# Patient Record
Sex: Female | Born: 1995 | Race: Asian | Hispanic: No | Marital: Single
Health system: Southern US, Community
[De-identification: ages and names within clinical notes are randomized; demographics above are authoritative.]

## PROBLEM LIST (undated history)

## (undated) DIAGNOSIS — M303 Mucocutaneous lymph node syndrome [Kawasaki]: Secondary | ICD-10-CM

## (undated) HISTORY — DX: Mucocutaneous lymph node syndrome (kawasaki): M30.3

---
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
## 2015-06-08 IMAGING — CR DG ELBOW COMPLETE 3+V*R*
3 series · 3 of 3 positions shown · non-contrast
Comparison: None.

CLINICAL DATA: Pain

RIGHT ELBOW - COMPLETE 3+ VIEW

[view not recorded (1 of 3)]
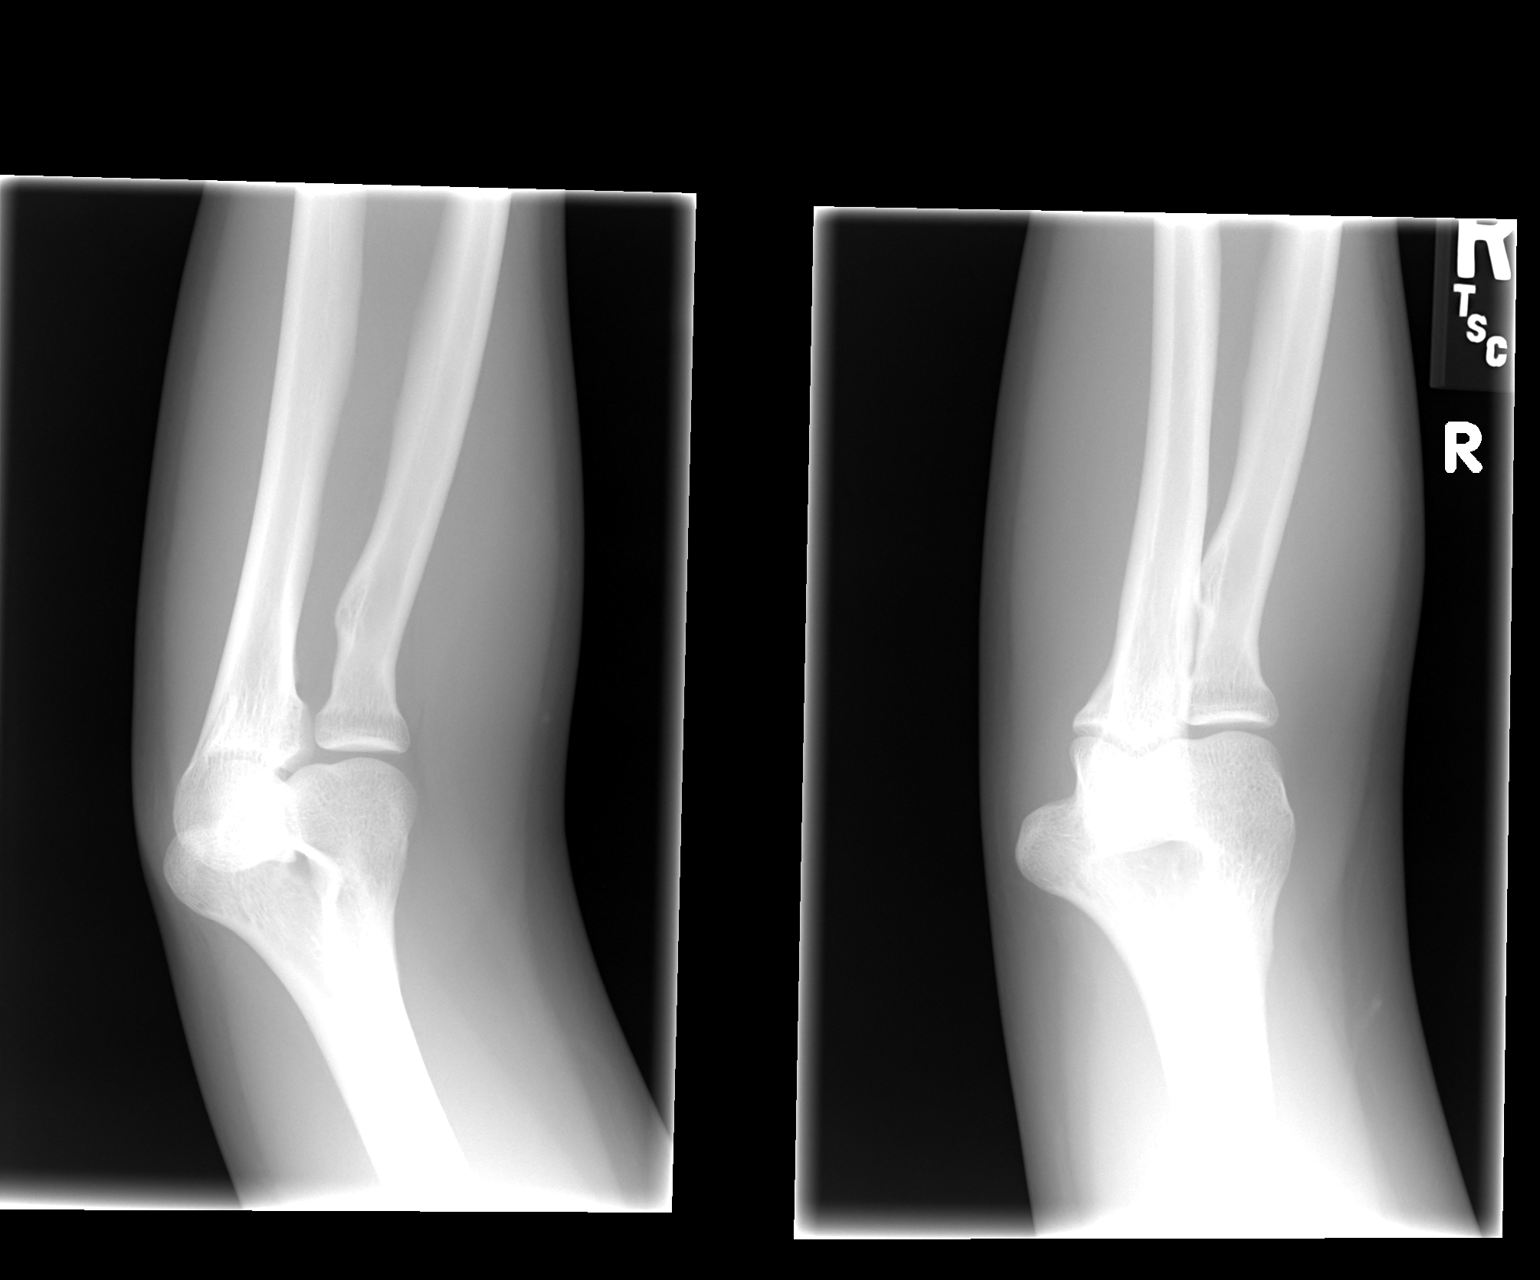

[view not recorded (2 of 3)]
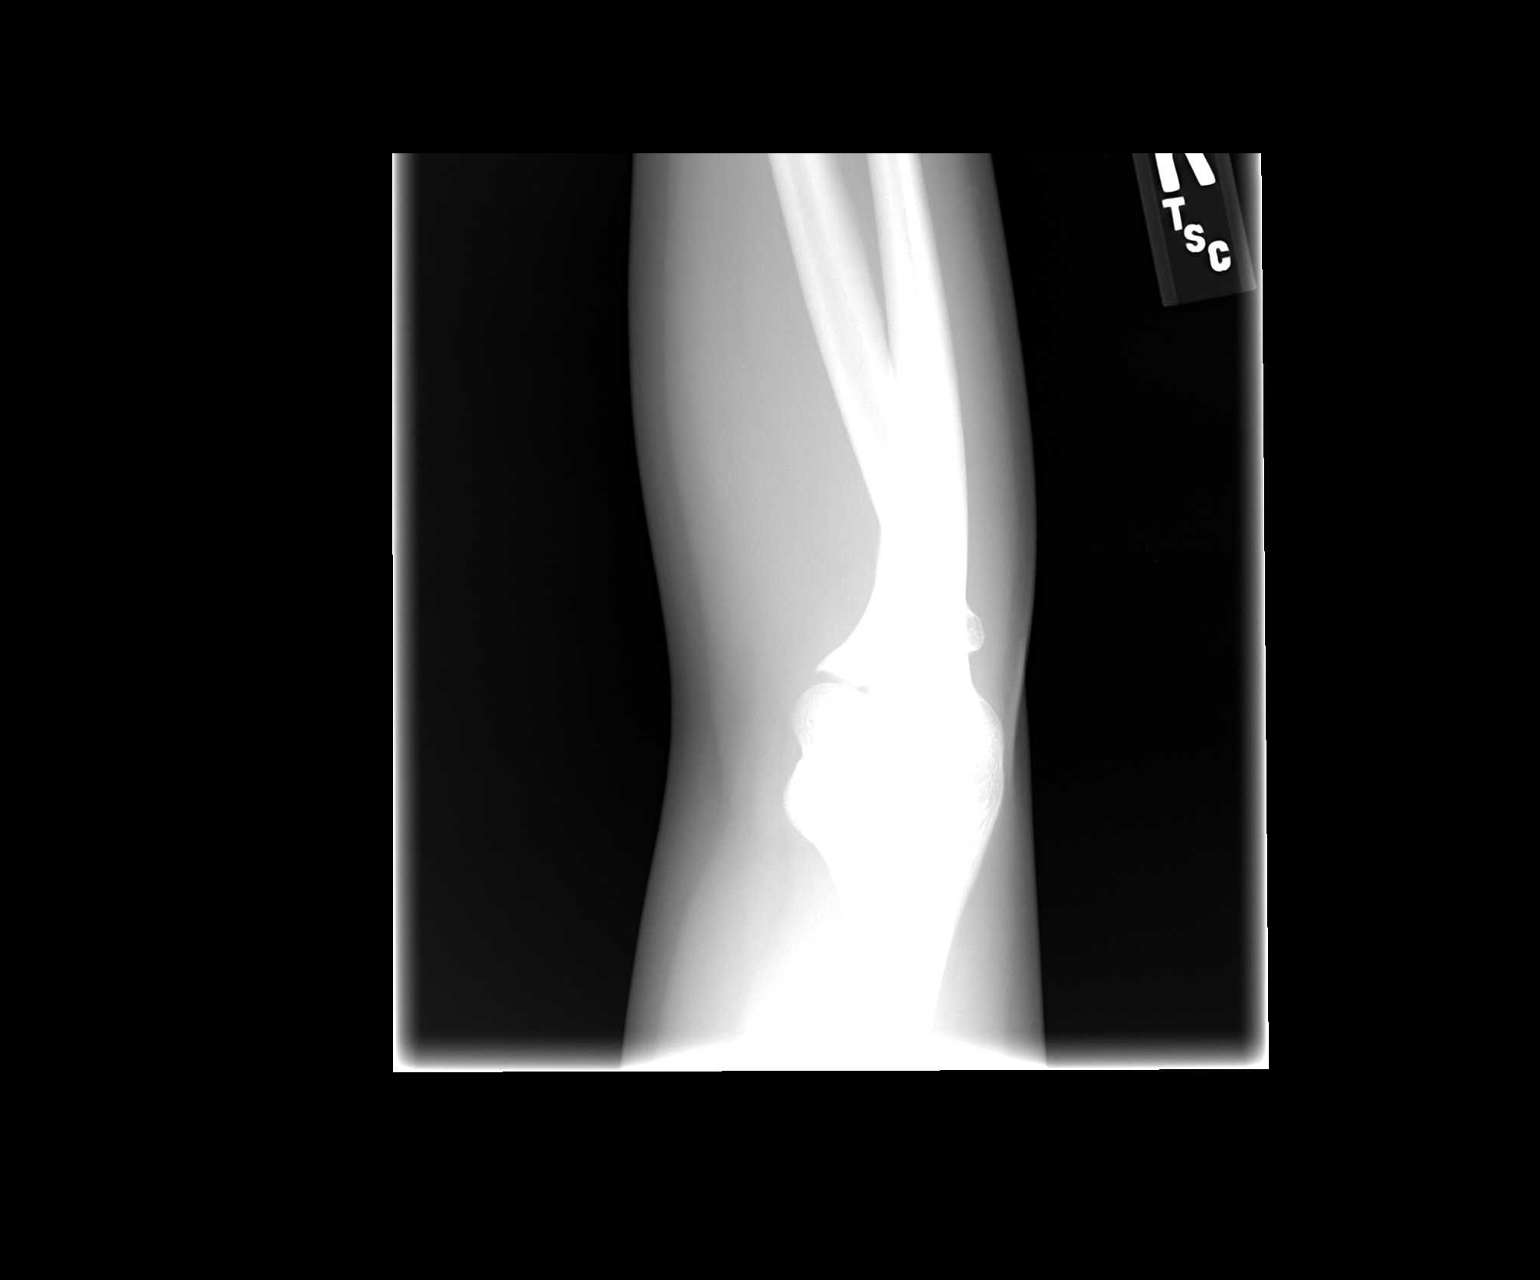

[view not recorded (3 of 3)]
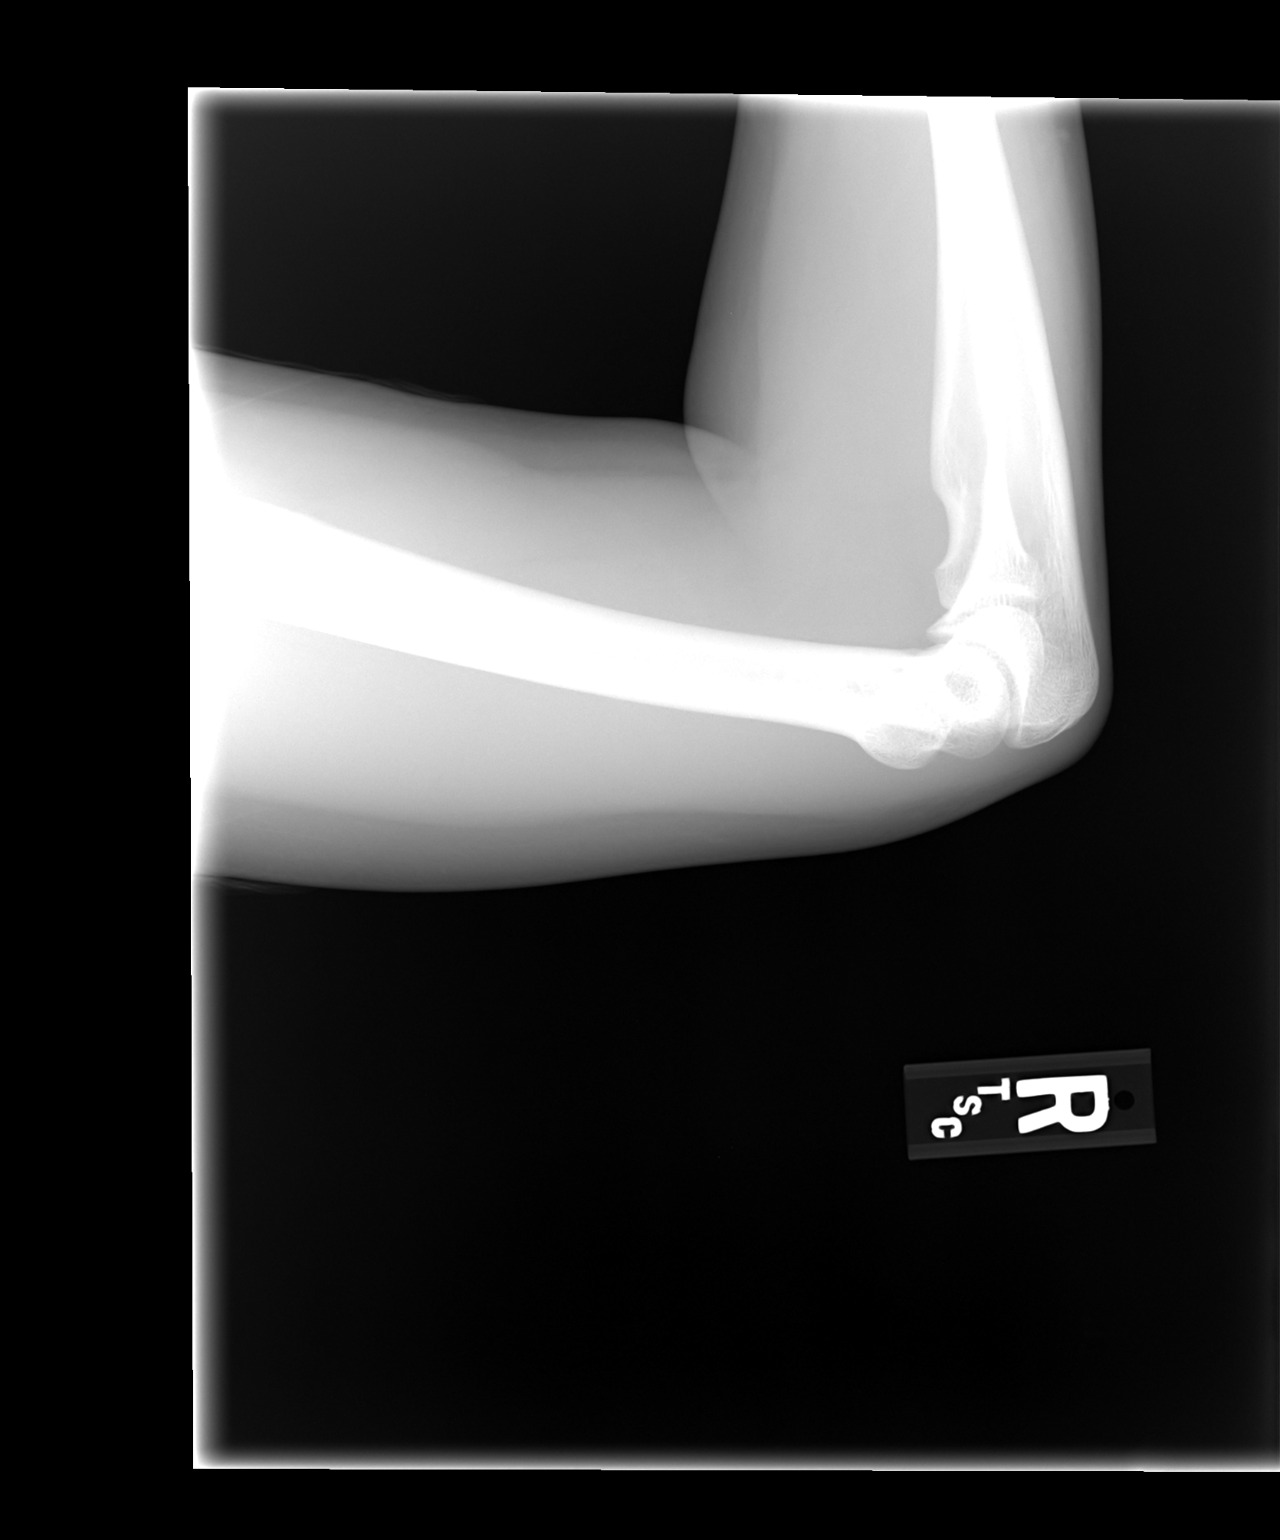

[3 of 3 positions shown; findings below may reference images not displayed]

FINDINGS: Frontal, lateral, and bilateral oblique views were
obtained.  There is no fracture, dislocation, or effusion.  Joint
spaces appear intact.  No erosive change.
IMPRESSION: No abnormality noted.

## 2016-02-27 ENCOUNTER — Ambulatory Visit (INDEPENDENT_AMBULATORY_CARE_PROVIDER_SITE_OTHER): Payer: 59 | Admitting: Internal Medicine

## 2016-02-27 ENCOUNTER — Encounter: Payer: Self-pay | Admitting: Internal Medicine

## 2016-02-27 VITALS — BP 118/88 | HR 90 | Temp 98.7°F | Ht 62.25 in | Wt 147.0 lb

## 2016-02-27 DIAGNOSIS — Z0000 Encounter for general adult medical examination without abnormal findings: Secondary | ICD-10-CM | POA: Diagnosis not present

## 2016-02-27 DIAGNOSIS — Z23 Encounter for immunization: Secondary | ICD-10-CM

## 2016-02-27 NOTE — Progress Notes (Signed)
Subjective:    Patient ID: Whitney Sharp, female    DOB: 12/04/1995, 21 y.o.   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 Norethin Ace-Eth Estrad-FE (LOESTRIN 24 FE PO) Take 1 tablet by mouth daily.      No current facility-administered medications on file prior to visit.     Allergies  Allergen Reactions  . Amoxicillin Hives    Past Medical History:  Diagnosis Date  . Kawasaki disease [facility])    had periodic cardiology follow up since. Echo normal last 2 times    No past surgical history on file.  No family history on file.  Social History   Social History  . Marital status: Single    Spouse name: N/A  . Number of children: N/A  . Years of education: N/A   Occupational History  . Not on file.   Social History Main Topics  . Smoking status: Passive Smoke Exposure - Never Smoker  . Smokeless tobacco: Never Used  . Alcohol use No  . Drug use: No  . Sexual activity: Not on file   Other Topics Concern  . Not on file   Social History Narrative   Mother:Nurse- Case Manager, divorced then remarried    Step dad works at  [employer]   Dad smokes in house   Review of Systems  Constitutional: Positive for unexpected weight change. Negative for fatigue.       Wears seat belt  HENT: Negative for dental problem, hearing loss and tinnitus.        Recent dental visit  Eyes: Negative for visual disturbance.       No diplopia or unilateral vision loss  Respiratory: Negative for cough, chest tightness and shortness of breath.   Cardiovascular: Negative for chest pain, palpitations and leg  swelling.  Gastrointestinal: Negative for abdominal pain, blood in stool, constipation and nausea.       No heartburn  Endocrine: Negative for polydipsia and polyuria.  Genitourinary: Negative for dysuria, frequency and hematuria.       On OCP--from gyn. Periods regular Same relationship for 5 years---still uses condoms  Musculoskeletal: Negative for arthralgias, back pain and joint swelling.  Skin: Negative for rash.  Allergic/Immunologic: Positive for environmental allergies. Negative for immunocompromised state.       Does well with OTC med  Neurological: Positive for headaches. Negative for dizziness, syncope and light-headedness.       Some migraines--ibuprofen/caffeine work  Hematological: Negative for adenopathy. Does not bruise/bleed easily.  Psychiatric/Behavioral: Negative for dysphoric mood and sleep disturbance. The patient is nervous/anxious.        Situational anxiety only       Objective:   Physical Exam  Constitutional: She is oriented to person, place, and time. She appears well-developed and well-nourished. No distress.  HENT:  Head: Normocephalic and atraumatic.  Right Ear: External ear normal.  Left Ear: External ear normal.  Mouth/Throat: Oropharynx is clear and moist. No oropharyngeal exudate.  Eyes: Conjunctivae are normal. Pupils are equal, round, and reactive to light.  Neck: Normal range of motion. Neck supple. No thyromegaly present.  Cardiovascular: Normal rate, regular rhythm, normal  heart sounds and intact distal pulses.  Exam reveals no gallop.   No murmur heard. Pulmonary/Chest: Effort normal and breath sounds normal. No respiratory distress. She has no wheezes. She has no rales.  Abdominal: Soft. There is no tenderness.  Musculoskeletal: She exhibits no edema or tenderness.  Lymphadenopathy:    She has no cervical adenopathy.  Neurological: She is alert and oriented to person, place, and time.  Skin: No rash noted. No erythema.  Psychiatric: She  has a normal mood and affect. Her behavior is normal.          Assessment & Plan:

## 2016-02-27 NOTE — Assessment & Plan Note (Signed)
Healthy Form signed for volunteer work Will get PPD there Counseled on healthy eating and exercise

## 2016-02-27 NOTE — Progress Notes (Signed)
Pre visit review using our clinic review tool, if applicable. No additional management support is needed unless otherwise documented below in the visit note. 

## 2016-03-04 DIAGNOSIS — Z01419 Encounter for gynecological examination (general) (routine) without abnormal findings: Secondary | ICD-10-CM | POA: Diagnosis not present

## 2016-04-26 DIAGNOSIS — Z01812 Encounter for preprocedural laboratory examination: Secondary | ICD-10-CM | POA: Diagnosis not present

## 2016-04-26 DIAGNOSIS — Z23 Encounter for immunization: Secondary | ICD-10-CM | POA: Diagnosis not present

## 2016-12-02 DIAGNOSIS — Z23 Encounter for immunization: Secondary | ICD-10-CM | POA: Diagnosis not present

## 2017-03-07 DIAGNOSIS — J029 Acute pharyngitis, unspecified: Secondary | ICD-10-CM | POA: Diagnosis not present

## 2017-03-10 DIAGNOSIS — J029 Acute pharyngitis, unspecified: Secondary | ICD-10-CM | POA: Diagnosis not present

## 2017-05-08 DIAGNOSIS — Z01419 Encounter for gynecological examination (general) (routine) without abnormal findings: Secondary | ICD-10-CM | POA: Diagnosis not present

## 2017-05-08 DIAGNOSIS — Z6827 Body mass index (BMI) 27.0-27.9, adult: Secondary | ICD-10-CM | POA: Diagnosis not present

## 2018-03-25 DIAGNOSIS — Z309 Encounter for contraceptive management, unspecified: Secondary | ICD-10-CM | POA: Diagnosis not present

## 2018-03-25 DIAGNOSIS — Z3040 Encounter for surveillance of contraceptives, unspecified: Secondary | ICD-10-CM | POA: Diagnosis not present

## 2018-03-25 DIAGNOSIS — Z3041 Encounter for surveillance of contraceptive pills: Secondary | ICD-10-CM | POA: Diagnosis not present
